# Patient Record
Sex: Female | Born: 1960 | Race: White | Hispanic: No | Smoking: Never smoker
Health system: Southern US, Community
[De-identification: ages and names within clinical notes are randomized; demographics above are authoritative.]

## PROBLEM LIST (undated history)

## (undated) DIAGNOSIS — D649 Anemia, unspecified: Secondary | ICD-10-CM

## (undated) DIAGNOSIS — E079 Disorder of thyroid, unspecified: Secondary | ICD-10-CM

## (undated) DIAGNOSIS — F419 Anxiety disorder, unspecified: Secondary | ICD-10-CM

## (undated) DIAGNOSIS — R011 Cardiac murmur, unspecified: Secondary | ICD-10-CM

## (undated) HISTORY — PX: OVARY SURGERY: SHX727

## (undated) HISTORY — PX: APPENDECTOMY: SHX54

## (undated) HISTORY — DX: Disorder of thyroid, unspecified: E07.9

## (undated) HISTORY — PX: TOE SURGERY: SHX1073

## (undated) HISTORY — PX: CYST REMOVAL NECK: SHX6281

## (undated) HISTORY — DX: Cardiac murmur, unspecified: R01.1

## (undated) HISTORY — DX: Anxiety disorder, unspecified: F41.9

## (undated) HISTORY — DX: Anemia, unspecified: D64.9

## (undated) HISTORY — PX: ABDOMINAL HYSTERECTOMY: SHX81

---

## 2012-12-04 ENCOUNTER — Encounter (INDEPENDENT_AMBULATORY_CARE_PROVIDER_SITE_OTHER): Payer: Self-pay | Admitting: General Surgery

## 2012-12-04 ENCOUNTER — Ambulatory Visit (INDEPENDENT_AMBULATORY_CARE_PROVIDER_SITE_OTHER): Payer: BC Managed Care – PPO | Admitting: General Surgery

## 2012-12-04 ENCOUNTER — Telehealth (INDEPENDENT_AMBULATORY_CARE_PROVIDER_SITE_OTHER): Payer: Self-pay | Admitting: General Surgery

## 2012-12-04 VITALS — BP 118/72 | HR 80 | Temp 98.9°F | Resp 15 | Ht 66.0 in | Wt 164.4 lb

## 2012-12-04 DIAGNOSIS — D179 Benign lipomatous neoplasm, unspecified: Secondary | ICD-10-CM

## 2012-12-04 NOTE — Progress Notes (Signed)
Chief complaint: Painful lump each forearm  History: Patient is a 52 year old female who for a number of months has had an increasingly uncomfortable subcutaneous lump on the volar aspect of her right forearm. More recently she has developed a similar small tender area on the left forearm. This is exactly where she rests her arms at her desk on each side and becomes quite tender and uncomfortable during the day. She has history of previous lipoma removal in other areas.  Past Medical History  Diagnosis Date  . Anemia   . Anxiety   . Heart murmur   . Thyroid disease    Past Surgical History  Procedure Laterality Date  . Toe surgery      toe was curved so broke bone and restraightened it  . Ovary surgery      removed cyst  . Appendectomy    . Abdominal hysterectomy    . Cyst removal neck     Current Outpatient Prescriptions  Medication Sig Dispense Refill  . Cyanocobalamin (VITAMIN B-12 IJ) Inject as directed.      Marland Kitchen levothyroxine (SYNTHROID, LEVOTHROID) 100 MCG tablet Take 100 mcg by mouth daily before breakfast.       No current facility-administered medications for this visit.   No Known Allergies History  Substance Use Topics  . Smoking status: Never Smoker   . Smokeless tobacco: Never Used  . Alcohol Use: Yes     Comment: wine occasional   Exam: BP 118/72  Pulse 80  Temp(Src) 98.9 F (37.2 C) (Temporal)  Resp 15  Ht 5\' 6"  (1.676 m)  Wt 164 lb 6.4 oz (74.571 kg)  BMI 26.55 kg/m2 General: Pleasant well-appearing Caucasian female Pertinent exam is limited extremities. On the volar aspect of the right midforearm is an approximately 2 cm freely movable fleshy subcutaneous mass. In a similar location on the left forearm is a proximally 1 cm very similar mass.  Assessment and plan: Symptomatic lipomas each forearm. As these are bothering her significantly I recommended removal under local anesthesia as an outpatient. We discussed the indications for nature of the surgery  as well as recovery and risks of bleeding and infection and recurrence. She understands and agrees to proceed.

## 2012-12-04 NOTE — Patient Instructions (Signed)

## 2012-12-04 NOTE — Telephone Encounter (Signed)
Advised patient of financial responsibility, per patient does not want to have surgery done

## 2013-06-10 ENCOUNTER — Other Ambulatory Visit: Payer: Self-pay | Admitting: Family Medicine

## 2013-06-10 DIAGNOSIS — Z1231 Encounter for screening mammogram for malignant neoplasm of breast: Secondary | ICD-10-CM

## 2013-06-29 ENCOUNTER — Ambulatory Visit: Payer: Self-pay

## 2013-07-03 ENCOUNTER — Ambulatory Visit
Admission: RE | Admit: 2013-07-03 | Discharge: 2013-07-03 | Disposition: A | Discharge status: Home / Self Care | Payer: BC Managed Care – PPO | Source: Ambulatory Visit | Attending: Family Medicine | Admitting: Family Medicine

## 2013-07-03 DIAGNOSIS — Z1231 Encounter for screening mammogram for malignant neoplasm of breast: Secondary | ICD-10-CM

## 2014-10-13 ENCOUNTER — Other Ambulatory Visit: Payer: Self-pay

## 2014-10-13 DIAGNOSIS — Z1231 Encounter for screening mammogram for malignant neoplasm of breast: Secondary | ICD-10-CM

## 2014-11-10 ENCOUNTER — Ambulatory Visit
Admission: RE | Admit: 2014-11-10 | Discharge: 2014-11-10 | Disposition: A | Discharge status: Home / Self Care | Payer: BLUE CROSS/BLUE SHIELD | Source: Ambulatory Visit

## 2014-11-10 DIAGNOSIS — Z1231 Encounter for screening mammogram for malignant neoplasm of breast: Secondary | ICD-10-CM

## 2015-05-10 ENCOUNTER — Emergency Department (HOSPITAL_COMMUNITY): Payer: BLUE CROSS/BLUE SHIELD

## 2015-05-10 ENCOUNTER — Encounter (HOSPITAL_COMMUNITY): Payer: Self-pay | Admitting: Emergency Medicine

## 2015-05-10 ENCOUNTER — Emergency Department (HOSPITAL_COMMUNITY)
Admission: EM | Admit: 2015-05-10 | Discharge: 2015-05-10 | Disposition: A | Discharge status: Home / Self Care | Payer: BLUE CROSS/BLUE SHIELD | Attending: Emergency Medicine | Admitting: Emergency Medicine

## 2015-05-10 DIAGNOSIS — S3992XA Unspecified injury of lower back, initial encounter: Secondary | ICD-10-CM | POA: Diagnosis not present

## 2015-05-10 DIAGNOSIS — Y9289 Other specified places as the place of occurrence of the external cause: Secondary | ICD-10-CM | POA: Insufficient documentation

## 2015-05-10 DIAGNOSIS — W000XXA Fall on same level due to ice and snow, initial encounter: Secondary | ICD-10-CM | POA: Diagnosis not present

## 2015-05-10 DIAGNOSIS — R011 Cardiac murmur, unspecified: Secondary | ICD-10-CM | POA: Diagnosis not present

## 2015-05-10 DIAGNOSIS — Y9389 Activity, other specified: Secondary | ICD-10-CM | POA: Insufficient documentation

## 2015-05-10 DIAGNOSIS — S199XXA Unspecified injury of neck, initial encounter: Secondary | ICD-10-CM | POA: Diagnosis not present

## 2015-05-10 DIAGNOSIS — Z8659 Personal history of other mental and behavioral disorders: Secondary | ICD-10-CM | POA: Diagnosis not present

## 2015-05-10 DIAGNOSIS — Y998 Other external cause status: Secondary | ICD-10-CM | POA: Diagnosis not present

## 2015-05-10 DIAGNOSIS — Z862 Personal history of diseases of the blood and blood-forming organs and certain disorders involving the immune mechanism: Secondary | ICD-10-CM | POA: Diagnosis not present

## 2015-05-10 DIAGNOSIS — S060X0A Concussion without loss of consciousness, initial encounter: Secondary | ICD-10-CM | POA: Insufficient documentation

## 2015-05-10 DIAGNOSIS — E079 Disorder of thyroid, unspecified: Secondary | ICD-10-CM | POA: Insufficient documentation

## 2015-05-10 DIAGNOSIS — R42 Dizziness and giddiness: Secondary | ICD-10-CM | POA: Diagnosis present

## 2015-05-10 DIAGNOSIS — F0781 Postconcussional syndrome: Secondary | ICD-10-CM

## 2015-05-10 LAB — I-STAT CHEM 8, ED
BUN: 12 mg/dL (ref 6–20)
CALCIUM ION: 1.15 mmol/L (ref 1.12–1.23)
Chloride: 104 mmol/L (ref 101–111)
Creatinine, Ser: 0.8 mg/dL (ref 0.44–1.00)
Glucose, Bld: 96 mg/dL (ref 65–99)
HEMATOCRIT: 41 % (ref 36.0–46.0)
HEMOGLOBIN: 13.9 g/dL (ref 12.0–15.0)
Potassium: 3.6 mmol/L (ref 3.5–5.1)
SODIUM: 141 mmol/L (ref 135–145)
TCO2: 26 mmol/L (ref 0–100)

## 2015-05-10 MED ORDER — LORAZEPAM 1 MG PO TABS
1.0000 mg | ORAL_TABLET | Freq: Once | ORAL | Status: AC
  Administered 2015-05-10: 1 mg via ORAL
  Filled 2015-05-10: qty 1

## 2015-05-10 MED ORDER — MECLIZINE HCL 25 MG PO TABS
25.0000 mg | ORAL_TABLET | Freq: Once | ORAL | Status: AC
  Administered 2015-05-10: 25 mg via ORAL
  Filled 2015-05-10: qty 1

## 2015-05-10 MED ORDER — MECLIZINE HCL 25 MG PO TABS: 25.0000 mg | ORAL_TABLET | Freq: Three times a day (TID) | ORAL | Status: AC | PRN

## 2015-05-10 MED ORDER — ACETAMINOPHEN 500 MG PO TABS
1000.0000 mg | ORAL_TABLET | Freq: Once | ORAL | Status: AC
  Administered 2015-05-10: 1000 mg via ORAL
  Filled 2015-05-10: qty 2

## 2015-05-10 NOTE — ED Notes (Signed)
Off floor for testing 

## 2015-05-10 NOTE — Discharge Instructions (Signed)
Post-Concussion Syndrome No driving or operating machinery while feeling dizzy or while taking the medication prescribed. Take Tylenol as needed for pain. See your primary care physician if continuing to feel dizziness or not feeling better in 1 week. Return if you feel worse for any reason. Post-concussion syndrome is the symptoms that can occur after a head injury. These symptoms can last from weeks to months. HOME CARE   Take medicines only as told by your doctor.  Do not take aspirin.  Sleep with your head raised to help with headaches.  Avoid activities that can cause another head injury.  Do not play contact sports like football, hockey, soccer, or basketball.  Do not do other risky activities like downhill skiing, martial arts, or horseback riding until your doctor says it is okay.  Keep all follow-up visits as told by your doctor. This is important. GET HELP IF:   You have a harder time:  Paying attention.  Focusing.  Remembering.  Learning new information.  Dealing with stress.  You need more time to complete tasks.  You are easily bothered (irritable).  You have more symptoms. Get help if you have any of these symptoms for more than two weeks after your injury:   Long-lasting (chronic) headaches.  Dizziness.  Trouble balancing.  Feeling sick to your stomach (nauseous).  Trouble with your vision.  Noise or light bothers you more.  Depression.  Mood swings.  Feeling worried (anxious).  Easily bothered.  Memory problems.  Trouble concentrating or paying attention.  Sleep problems.  Feeling tired all of the time. GET HELP RIGHT AWAY IF:  You feel confused.  You feel very sleepy.  You are hard to wake up.  You feel sick to your stomach.  You keep throwing up (vomiting).  You feel like you are moving when you are not (vertigo).  Your eyes move back and forth very quickly.  You start shaking (convulsing) or pass out (faint).  You  have very bad headaches that do not get better with medicine.  You cannot use your arms or legs like normal.  One of the black centers of your eyes (pupils) is bigger than the other.  You have clear or bloody fluid coming from your nose or ears.  Your problems get worse, not better. MAKE SURE YOU:  Understand these instructions.  Will watch your condition.  Will get help right away if you are not doing well or get worse.   This information is not intended to replace advice given to you by your health care provider. Make sure you discuss any questions you have with your health care provider.   Document Released: 05/24/2004 Document Revised: 05/07/2014 Document Reviewed: 07/22/2013 Elsevier Interactive Patient Education [employer].

## 2015-05-10 NOTE — ED Notes (Signed)
Back from MRI.

## 2015-05-10 NOTE — ED Notes (Addendum)
Per EMS, fell on Sunday. C/o neck stiffness with vertigo. Examined by EMS on Sunday, no pain at the time.   Pt states the vertigo is terrible, she has a hx of vertigo. States she slipped on the ice, hit her head, did not lose consciousness, has been nauseous, not on any blood thinners, bruise to back of head, bilateral grip/dorsi and plantar flexion, PERRL, no slurred speech, alert and oriented x 4, denies vomiting.

## 2015-05-10 NOTE — ED Provider Notes (Signed)
CSN: [id number]     Arrival date & time 05/10/15  0844 History   First MD Initiated Contact with Patient 05/10/15 [phone]     Chief Complaint  Patient presents with  . Fall  . Dizziness     (Consider location/radiation/quality/duration/timing/severity/associated sxs/prior Treatment) HPI Patient slipped and fell on ice 3 days ago , landing on her buttocks and then falling backwards and striking her occiput. She developed vertigo onset 2 days ago. Symptoms accompanied by occipital headache and neck pain and pain at presacral area. Vertigo is worse with closing her eyes have not improved by anything no nausea or vomiting no changes in vision or speech. No other associated symptoms. No treatment prior to coming here.  Past Medical History  Diagnosis Date  . Anemia   . Anxiety   . Heart murmur   . Thyroid disease    Past Surgical History  Procedure Laterality Date  . Toe surgery      toe was curved so broke bone and restraightened it  . Ovary surgery      removed cyst  . Appendectomy    . Abdominal hysterectomy    . Cyst removal neck     Family History  Problem Relation Age of Onset  . Cancer Mother     breast  . Cancer Father     lung  . Cancer Sister     cervical   Social History  Substance Use Topics  . Smoking status: Never Smoker   . Smokeless tobacco: Never Used  . Alcohol Use: Yes     Comment: wine occasional   OB History    No data available     Review of Systems  Musculoskeletal: Positive for back pain and neck pain.  Neurological: Positive for headaches.  All other systems reviewed and are negative.     Allergies  Tyloxapol  Home Medications   Prior to Admission medications   Medication Sig Start Date End Date Taking? Authorizing Provider  levothyroxine (SYNTHROID, LEVOTHROID) 100 MCG tablet Take 100 mcg by mouth daily before breakfast.   Yes Historical Provider, MD   BP 116/92 mmHg  Pulse 93  Temp(Src) 97.8 F (36.6 C) (Oral)  Resp 16  SpO2  98% Physical Exam  Constitutional: She is oriented to person, place, and time. She appears well-developed and well-nourished. No distress.  HENT:  Head: Normocephalic and atraumatic.  Bilateral tympanic membranes  Eyes: Conjunctivae are normal. Pupils are equal, round, and reactive to light.  Neck: Neck supple. No tracheal deviation present. No thyromegaly present.  Cardiovascular: Normal rate and regular rhythm.   No murmur heard. Pulmonary/Chest: Effort normal and breath sounds normal.  Abdominal: Soft. Bowel sounds are normal. She exhibits no distension. There is no tenderness.  Musculoskeletal: Normal range of motion. She exhibits no edema or tenderness.  Cervical spine nontender thoracic spine nontender lumbar spine nontender. She is tender at presacral area. No crepitance pelvis stable. All 4 extremities without contusion abrasion or tenderness neurovascular intact  Neurological: She is alert and oriented to person, place, and time. No cranial nerve deficit. Coordination normal.  Gait normal Romberg normal pronator drift normal DTR symmetric bilaterally at knee jerk and ankle jerk biceps  Skin: Skin is warm and dry. No rash noted.  Psychiatric: She has a normal mood and affect.  Nursing note and vitals reviewed.   ED Course  Procedures (including critical care time) Labs Review Labs Reviewed  I-STAT CHEM 8, ED    Imaging Review No results  found. I have personally reviewed and evaluated these images and lab results as part of my medical decision-making.   EKG Interpretation   Date/Time:  Tuesday May 10 2015 09:12:59 EST Ventricular Rate:  91 PR Interval:  149 QRS Duration: 84 QT Interval:  355 QTC Calculation: 437 R Axis:   63 Text Interpretation:  Sinus rhythm Consider right atrial enlargement  Baseline wander in lead(s) V3 No old tracing to compare Confirmed by  Rita Prom  MD, Equan Cogbill [phone]) on 05/10/2015 9:42:00 AM       11:55 AM patient feels improved after  treatment with meclizine and Ativan orally and Tylenol. She is alert and ambulatory. No longer dizzy. Results for orders placed or performed during the hospital encounter of 05/10/15  I-Stat Chem 8, ED  (not at [facility], [facility])  Result Value Ref Range   Sodium 141 135 - 145 mmol/L   Potassium 3.6 3.5 - 5.1 mmol/L   Chloride 104 101 - 111 mmol/L   BUN 12 6 - 20 mg/dL   Creatinine, Ser 0.80 0.44 - 1.00 mg/dL   Glucose, Bld 96 65 - 99 mg/dL   Calcium, Ion 1.15 1.12 - 1.23 mmol/L   TCO2 26 0 - 100 mmol/L   Hemoglobin 13.9 12.0 - 15.0 g/dL   HCT 41.0 36.0 - 46.0 %   Ct Pelvis Wo Contrast  05/10/2015  CLINICAL DATA:  Slipped on ice 05/08/2015, left buttock pain EXAM: CT PELVIS WITHOUT CONTRAST TECHNIQUE: Multidetector CT imaging of the pelvis was performed following the standard protocol without intravenous contrast. COMPARISON:  None. FINDINGS: Sagittal images shows no evidence of sacrum or coccyx fracture. Mild disc space flattening at L5-S1 level. Coronal images shows no hip fracture. Bilateral hip joints are symmetrical in appearance. Minimal degenerative changes pubic symphysis. No pubic ramus fracture. The SI joints are symmetrical in appearance. There is no evidence of pelvic wall hematoma. Axial images shows no iliac bone fracture. No subcutaneous fluid collection. The distal abdominal aorta and iliac arteries are unremarkable. No pelvic hematoma. The patient is status post hysterectomy. Urinary bladder is under distended grossly unremarkable. No colonic obstruction. The patient is status post appendectomy. No pericecal inflammation. IMPRESSION: 1. No acute fractures are identified. No pelvic hematoma or fluid collection. 2. Status post hysterectomy. Status post appendectomy. No pericecal inflammation. 3. Mild degenerative changes pubic symphysis. Mild disc space flattening at L5-S1 level. No sacral or coccyx fracture. Electronically Signed   By: Lahoma Crocker M.D.   On: 05/10/2015 10:45   Mr Brain Wo  Contrast  05/10/2015  CLINICAL DATA:  Severe vertigo, worse after falling on ice 2 days ago. No loss of consciousness. Trauma to head. EXAM: MRI HEAD WITHOUT CONTRAST TECHNIQUE: Multiplanar, multiecho pulse sequences of the brain and surrounding structures were obtained without intravenous contrast. COMPARISON:  None. FINDINGS: No acute infarct, hemorrhage, or mass lesion is present. The ventricles are of normal size. No significant extraaxial fluid collection is present. No significant white matter disease is present. No acute or focal intracranial trauma is present. The internal auditory canals are within normal limits bilaterally. The brainstem and cerebellum are within normal limits. The skullbase is within normal limits. Midline sagittal images are unremarkable. Flow is present in the major intracranial arteries. The globes and orbits are within normal limits. The paranasal sinuses and mastoid air cells are clear. IMPRESSION: 1. Normal MRI appearance the brain. No evidence for acute trauma or etiology of vertigo. Electronically Signed   By: San Morelle M.D.   On:  05/10/2015 11:22    MDM  Plan prescription meclizine. Tylenol as needed for pain. 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 Follow-up with PMD if still symptomatic in a week Final diagnoses:  None   diagnosis #1 fall #2 vertigo #3 contusion to back      Orlie Dakin, MD 05/10/15 1201

## 2015-10-28 ENCOUNTER — Other Ambulatory Visit: Payer: Self-pay | Admitting: Family Medicine

## 2015-10-28 DIAGNOSIS — Z1231 Encounter for screening mammogram for malignant neoplasm of breast: Secondary | ICD-10-CM

## 2015-11-14 ENCOUNTER — Ambulatory Visit
Admission: RE | Admit: 2015-11-14 | Discharge: 2015-11-14 | Disposition: A | Discharge status: Home / Self Care | Payer: BLUE CROSS/BLUE SHIELD | Source: Ambulatory Visit | Attending: Family Medicine | Admitting: Family Medicine

## 2015-11-14 DIAGNOSIS — Z1231 Encounter for screening mammogram for malignant neoplasm of breast: Secondary | ICD-10-CM

## 2016-01-04 ENCOUNTER — Encounter: Payer: Self-pay | Admitting: Genetic Counselor

## 2016-02-08 ENCOUNTER — Encounter: Payer: Self-pay | Admitting: Genetic Counselor

## 2016-02-09 ENCOUNTER — Encounter: Payer: Self-pay | Admitting: Genetic Counselor

## 2016-02-09 ENCOUNTER — Ambulatory Visit (HOSPITAL_BASED_OUTPATIENT_CLINIC_OR_DEPARTMENT_OTHER): Payer: BLUE CROSS/BLUE SHIELD | Admitting: Genetic Counselor

## 2016-02-09 ENCOUNTER — Other Ambulatory Visit: Payer: BLUE CROSS/BLUE SHIELD

## 2016-02-09 DIAGNOSIS — Z800 Family history of malignant neoplasm of digestive organs: Secondary | ICD-10-CM

## 2016-02-09 DIAGNOSIS — Z801 Family history of malignant neoplasm of trachea, bronchus and lung: Secondary | ICD-10-CM

## 2016-02-09 DIAGNOSIS — Z803 Family history of malignant neoplasm of breast: Secondary | ICD-10-CM | POA: Diagnosis not present

## 2016-02-09 DIAGNOSIS — Z808 Family history of malignant neoplasm of other organs or systems: Secondary | ICD-10-CM

## 2016-02-09 DIAGNOSIS — Z8049 Family history of malignant neoplasm of other genital organs: Secondary | ICD-10-CM

## 2016-02-09 DIAGNOSIS — Z809 Family history of malignant neoplasm, unspecified: Secondary | ICD-10-CM

## 2016-02-09 DIAGNOSIS — Z8371 Family history of colonic polyps: Secondary | ICD-10-CM

## 2016-02-09 DIAGNOSIS — Z315 Encounter for genetic counseling: Secondary | ICD-10-CM

## 2016-02-09 NOTE — Progress Notes (Signed)
REFERRING PROVIDER: Antony Blackbird, MD [phone], [place] [address]  PRIMARY PROVIDER:  FULP, CAMMIE, MD  PRIMARY REASON FOR VISIT:  1. Family history of breast cancer in female   2. Family history of pancreatic cancer   3. Family history of colonic polyps   4. Family history of lung cancer   5. Family history of basal cell carcinoma   6. Family history of cervical cancer   7. Family history of cancer      HISTORY OF PRESENT ILLNESS:   Kaitlin Escobar, a 55 y.o. female, was seen for a [place] cancer genetics consultation at the request of Dr. Chapman Fitch due to a family history of breast and other cancers.  Kaitlin Escobar presents to clinic today to discuss the possibility of a hereditary predisposition to cancer, genetic testing, and to further clarify her future cancer risks, as well as potential cancer risks for family members.    Kaitlin Escobar is a 55 y.o. female with no personal history of cancer.  She has does have a history of surgical removal of multiple benign lipomas.   HORMONAL RISK FACTORS:  Menarche was at age 83.  First live birth at age 6.  OCP use for approximately 25 years.  Ovaries intact: yes.  Hysterectomy: yes, in 2008 due to fibroids.  Menopausal status: had a hysterectomy in 2008.  HRT use: 0 years. Colonoscopy: yes, first colonoscopy in 2010; reports no polyps, normal other than hemorrhoids; is on a 10-year schedule. Mammogram within the last year: yes. Number of breast biopsies: 0. Up to date with pelvic exams:  n/a. Any excessive radiation exposure/other exposures in the past:  Reports history of secondhand smoke exposure (father smoked)2  Past Medical History:  Diagnosis Date  . Anemia   . Anxiety   . Heart murmur   . Thyroid disease     Past Surgical History:  Procedure Laterality Date  . ABDOMINAL HYSTERECTOMY    . APPENDECTOMY    . CYST REMOVAL NECK    . OVARY SURGERY     removed cyst  . TOE SURGERY     toe was curved so broke bone and  restraightened it    Social History   Social History  . Marital status: Divorced    Spouse name: N/A  . Number of children: N/A  . Years of education: N/A   Social History Main Topics  . Smoking status: Never Smoker  . Smokeless tobacco: Never Used  . Alcohol use Yes     Comment: wine occasional - 1 glass per month or less  . Drug use: No  . Sexual activity: Not on file   Other Topics Concern  . Not on file   Social History Narrative  . No narrative on file     FAMILY HISTORY:  We obtained a detailed, 4-generation family history.  Significant diagnoses are listed below: Family History  Problem Relation Age of Onset  . Breast cancer Mother 36  . Lung cancer Father 36    d. 50; former smoker; +possible asbestos exposure  . Colon polyps Father     >10 polyps; "lots of polyps"; removed, sometimes every 6 months  . 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 Cervical cancer Sister     unspecified age  . Alzheimer's disease Maternal Grandmother     d. 74  . Breast cancer Maternal Grandmother     dx 90s - no treatment due to  age  . Alzheimer's disease Maternal Grandfather     d. 46  . Other Maternal Grandfather     leg amputation following GSW  . Mental illness Paternal Grandmother   . Other Son     hx of benign breast lump; no surgery  . Heart attack Son 23    worked out a lot and did not take care of himself after; does not see cardiologist  . Cancer Other     maternal great uncle (MGM's brother) dx NOS cancer; d. later age; lim info  . Pancreatic cancer Paternal Uncle     dx. [phone]  . Colon polyps Paternal Uncle     "lots of polyps"  . Kidney Stones Paternal Uncle     Kaitlin Escobar has two sons, ages 15 and 48.  Her oldest son was found to have a lump in one of his breasts and this was determined to be benign and he has not needed surgery.  This son has one 84-year old daughter.  Kaitlin Escobar youngest son had a heart attack three years ago, at  approximately 56 years of age.  She attributes this to his working out aggressively and not "putting enough back into his body" afterwards.  She reports that he does not follow-up with a cardiologist.  Kaitlin Escobar has one full sister who is currently 33 and who has a history of cervical cancer at an unspecified age.  She has two daughters of her own--both of whom are cancer-free.  Kaitlin Escobar also has two paternal half-brothers who are currently in their 33s. She has limited information for these brothers.  She does report that one of her half-brothers has a grandchild who has been diagnosed with a genetic brain abnormality of unspecified type.    Kaitlin Escobar mother was diagnosed with breast cancer at 2, and she later suffered a recurrence and this cancer metastasized to her back and spine.  She passed away at 101.  Kaitlin Escobar mother was an only child.  Kaitlin Escobar maternal grandmother was found to have a breast lump in her 22s, probably a breast cancer, but that was not treated due to her age and due to her having alzheimer's.  She passed away at 17. Kaitlin Escobar grandmother had 55 siblings, most of whom passed away at later ages in life.  Kaitlin Escobar has limited information for these maternal great aunts/uncles, but does recall that one great uncle had an unspecified type of cancer.  Kaitlin Escobar maternal grandfather died of alzheimer's related illness at 58.    Kaitlin Escobar father had a history of smoking and he was diagnosed with lung cancer at 11 and passed away a year later.  He had a history of basal cell carcinoma and also a history of greater than 10 colon polyps.  Kaitlin Escobar describes that he had "lots of polyps" and had to go for colonoscopies with polypectomies every 6 months, at one time in his life.  He had three full brothers and one full sister, all of whom have passed away.  Kaitlin Escobar has no information for most of these siblings.  She does report that one uncle was diagnosed with pancreatic cancer and passed away  soon after at the age of 10.  This uncle was not a smoker or drinker.  He also had a history of "lots of polyps" and he had a history of kidney stones.  This uncle had no biological children, only two adopted children.  Kaitlin Escobar has  some limited information for some paternal first cousins, but she is unaware of any cancer history for those she has been in touch with.  Her paternal grandmother had a history of mental illness and she passed from suicide in her 31s.  Her grandfather died before. Ms. Gullo was born and she has no further information for him.  Ms. Lizana has no information for her paternal great aunts/uncles and great grandparents.    Ms. Pacifico is unaware of any previous family history of genetic testing for hereditary cancer risks.  Patient's maternal ancestors are of [place] and [name] descent, and paternal ancestors are of Native [place] and [place] descent. There is no reported Ashkenazi Jewish ancestry. There is no known consanguinity.  GENETIC COUNSELING ASSESSMENT: Kaitlin Escobar is a 55 y.o. female with a family history of cancer and colon polyps which is somewhat suggestive of a hereditary cancer syndrome and predisposition to cancer. We, therefore, discussed and recommended the following at today's visit.   DISCUSSION: We reviewed the characteristics, features and inheritance patterns of hereditary cancer syndromes, particularly those caused by mutations within the BRCA1/2, Lynch syndrome, and APC genes. We also discussed genetic testing, including the appropriate family members to test, the process of testing, insurance coverage and turn-around-time for results. We discussed the implications of a negative, positive and/or variant of uncertain significant result. We recommended Ms. Rion pursue genetic testing for the 42-gene [facility] Common Hereditary Cancers Panel (Breast, Gyn, GI) through [facility].  The 42-gene [facility] Common Hereditary Cancers Panel (Breast, Gyn, GI) performed  by [facility], [place]) includes sequencing and/or deletion/duplication analysis for the following genes: APC, ATM, AXIN2, BARD1, BMPR1A, BRCA1, BRCA2, BRIP1, CDH1, CDKN2A, CHEK2, DICER1, EPCAM, GREM1, KIT, MEN1, MLH1, MSH2, MSH6, MUTYH, NBN, NF1, PALB2, PDGFRA, PMS2, POLD1, POLE, PTEN, RAD50, RAD51C, RAD51D, SDHA, SDHB, SDHC, SDHD, SMAD4, SMARCA4, STK11, TP53, TSC1, TSC2, and VHL.  Based on Ms. Kutscher family history of cancer, she meets medical criteria for genetic testing. Despite that she meets criteria, she may still have an out of pocket cost. We discussed that most people will have an out-of-pocket cost of $100 or less.  She is advised to call us or the laboratory if she receives an expensive bill, as the laboratory has financial assistance options that could reduce her cost, if she qualifies.  Based on the patient's personal and family history, statistical models Baker Janus model and IBIS/Tyrer Lowella Petties)  and literature data were used to estimate her risk of developing breast cancer. These estimate her lifetime risk of developing breast cancer to be approximately 14% to 23.4%. This estimation does not take into account any genetic testing results.  The patient's lifetime breast cancer risk is a preliminary estimate based on available information using one of several models endorsed by the [place] (ACS). The ACS recommends consideration of breast MRI screening as an adjunct to mammography for patients at high risk (defined as 20% or greater lifetime risk). A more detailed breast cancer risk assessment can be considered, if clinically indicated.   PLAN: After considering the risks, benefits, and limitations, Ms. Maselli  provided informed consent to pursue genetic testing and the blood sample was sent to [facility] for analysis of the 42-gene [facility] Common Hereditary Cancers Panel (Breast, Gyn, GI). Results should be available within approximately 2-3 weeks' time, at  which point they will be disclosed by telephone to Ms. Carolan, as will any additional recommendations warranted by these results. Ms. Hofmeister will receive a summary of  her genetic counseling visit and a copy of her results once available. This information will also be available in Epic. We encouraged Ms. Rossi to remain in contact with cancer genetics annually so that we can continuously update the family history and inform her of any changes in cancer genetics and testing that may be of benefit for her family. Ms. Dudziak questions were answered to her satisfaction today. Our contact information was provided should additional questions or concerns arise.  Thank you for the referral and allowing [name] to share in the care of your patient.   Jeanine Luz, MS, [facility] Certified Genetic Counselor [email or web address] Phone: [phone]  The patient was seen for a total of 60 minutes in face-to-face genetic counseling.  This patient was discussed with Drs. Magrinat, Lindi Adie and/or Burr Medico who agrees with the above.    _______________________________________________________________________ For Office Staff:  Number of people involved in session: 1 Was an Intern/ student involved with case: no

## 2016-03-07 ENCOUNTER — Telehealth: Payer: Self-pay | Admitting: Genetic Counselor

## 2016-03-07 ENCOUNTER — Other Ambulatory Visit: Payer: Self-pay | Admitting: Specialist

## 2016-03-07 DIAGNOSIS — M8580 Other specified disorders of bone density and structure, unspecified site: Secondary | ICD-10-CM

## 2016-03-08 NOTE — Telephone Encounter (Signed)
Discussed with Kaitlin Escobar that her genetic test results were negative for mutations within any of 42 genes on the [facility] Common Hereditary Cancers Panel (Breast, Gyn, GI) through [facility].  Additionally, no variants of uncertain significance (VUSes) were found.  Discussed that this is most likely a reassuring result for [name].  Discussed that her sister can still have genetic testing, as there may be a mutation in [name] maternal family that she herself just did not inherit.  [name] and other women in the family are still at an increased risk for breast cancer, simply due to the family history of breast cancer.  She should continue to follow her doctors' recommendations for cancer screening.  [name] is happy to receive this news.  She knows she is welcome to call with any questions she may have.  I have emailed her a copy of her results.

## 2016-03-09 ENCOUNTER — Ambulatory Visit
Admission: RE | Admit: 2016-03-09 | Discharge: 2016-03-09 | Disposition: A | Discharge status: Home / Self Care | Payer: BLUE CROSS/BLUE SHIELD | Source: Ambulatory Visit | Attending: Specialist | Admitting: Specialist

## 2016-03-09 ENCOUNTER — Ambulatory Visit: Payer: Self-pay | Admitting: Genetic Counselor

## 2016-03-09 DIAGNOSIS — Z1379 Encounter for other screening for genetic and chromosomal anomalies: Secondary | ICD-10-CM

## 2016-03-09 DIAGNOSIS — M8580 Other specified disorders of bone density and structure, unspecified site: Secondary | ICD-10-CM

## 2016-03-09 DIAGNOSIS — Z803 Family history of malignant neoplasm of breast: Secondary | ICD-10-CM

## 2016-03-09 DIAGNOSIS — Z809 Family history of malignant neoplasm, unspecified: Secondary | ICD-10-CM

## 2016-03-11 DIAGNOSIS — Z1379 Encounter for other screening for genetic and chromosomal anomalies: Secondary | ICD-10-CM | POA: Insufficient documentation

## 2016-03-11 NOTE — Progress Notes (Signed)
GENETIC TEST RESULT  HPI: Ms. Kaitlin Escobar was previously seen in the [facility] due to a family history of breast and other cancers, family history of colon polyps, and concerns regarding a hereditary predisposition to cancer. Please refer to our prior cancer genetics clinic note from February 09, 2016 for more information regarding Ms. Kaitlin Escobar's medical, social and family histories, and our assessment and recommendations, at the time. Ms. Kaitlin Escobar recent genetic test results were disclosed to her, as were recommendations warranted by these results. These results and recommendations are discussed in more detail below.  GENETIC TEST RESULTS: At the time of Ms. Kaitlin Escobar visit on 02/09/16, we recommended she pursue genetic testing of the 42-gene [facility] Common Hereditary Cancers Panel (Breast, Gyn, GI) through [facility]. The 42-gene [facility] Common Hereditary Cancers Panel (Breast, Gyn, GI) performed by [facility], [place]) includes sequencing and/or deletion/duplication analysis for the following genes: APC, ATM, AXIN2, BARD1, BMPR1A, BRCA1, BRCA2, BRIP1, CDH1, CDKN2A, CHEK2, DICER1, EPCAM, GREM1, KIT, MEN1, MLH1, MSH2, MSH6, MUTYH, NBN, NF1, PALB2, PDGFRA, PMS2, POLD1, POLE, PTEN, RAD50, RAD51C, RAD51D, SDHA, SDHB, SDHC, SDHD, SMAD4, SMARCA4, STK11, TP53, TSC1, TSC2, and VHL.  Those results are now back, the report date for which is March 06, 2016.  Genetic testing was normal, and did not reveal a deleterious mutation in these genes.  Additionally, no variants of uncertain significance (VUSes) were found.  The test report will be scanned into EPIC and will be located under the Results Review tab in the Pathology>Molecular Pathology section.   We discussed with Ms. Kaitlin Escobar that since the current genetic testing is not perfect, it is possible there may be a gene mutation in one of these genes that current testing cannot detect, but that chance is small. We also discussed,  that it is possible that another gene that has not yet been discovered, or that we have not yet tested, is responsible for the cancer diagnoses in the family, and it is, therefore, important to remain in touch with cancer genetics in the future so that we can continue to offer Ms. Kaitlin Escobar the most up-to-date genetic testing.   CANCER SCREENING RECOMMENDATIONS: This normal result is reassuring and indicates that Ms. Kaitlin Escobar does not likely have an increased risk of cancer due to a mutation in one of these genes.  We, therefore, recommended  Ms. Kaitlin Escobar continue to follow the cancer screening guidelines provided by her primary healthcare providers.   RECOMMENDATIONS FOR FAMILY MEMBERS: Women in this family might be at some increased risk of developing breast cancer, over the general population risk, simply due to the family history of breast cancer. We recommended women in this family have a yearly mammogram beginning at age 34, or 68 years younger than the earliest onset of cancer, an annual clinical breast exam, and perform monthly breast self-exams. Women in this family should also have a gynecological exam as recommended by their primary provider. All family members should have a colonoscopy by age 29.  Based on Ms. Kaitlin Escobar family history, we recommended her sister also have genetic counseling and testing, as there could be a gene mutation in the family that explains the maternal family history of breast cancer, that Ms. Kaitlin Escobar herself just did not inherit. Ms. Kaitlin Escobar will let us know if we can be of any assistance in coordinating genetic counseling and/or testing for this family member.   FOLLOW-UP: Lastly, we discussed with Ms. Kaitlin Escobar that cancer genetics is a rapidly advancing field and it is  possible that new genetic tests will be appropriate for her and/or her family members in the future. We encouraged her to remain in contact with cancer genetics on an annual basis so we can update her personal and family  histories and let her know of advances in cancer genetics that may benefit this family.   Our contact number was provided. Ms. Kaitlin Escobar questions were answered to her satisfaction, and she knows she is welcome to call us at anytime with additional questions or concerns.   Kaitlin Luz, MS, [facility] Certified Genetic Counselor [email or web address] Phone: [phone]

## 2016-10-19 ENCOUNTER — Encounter (HOSPITAL_COMMUNITY): Payer: Self-pay

## 2016-10-25 ENCOUNTER — Encounter (HOSPITAL_COMMUNITY): Payer: Self-pay

## 2016-12-08 IMAGING — CT CT PELVIS W/O CM
2 of 3 series · 16 of 46 positions shown, 18 images · non-contrast
Comparison: None.

CLINICAL DATA: Slipped on ice 05/08/2015, left buttock pain

EXAM:
CT PELVIS WITHOUT CONTRAST
TECHNIQUE: Multidetector CT imaging of the pelvis was performed following the
standard protocol without intravenous contrast.

[Series 3: pelvis st · axial · 0.74mm/px · z∈[+1142,+1336]mm · 13 of 45 slices shown, 15 images]
[im 3/45  soft-tissue]
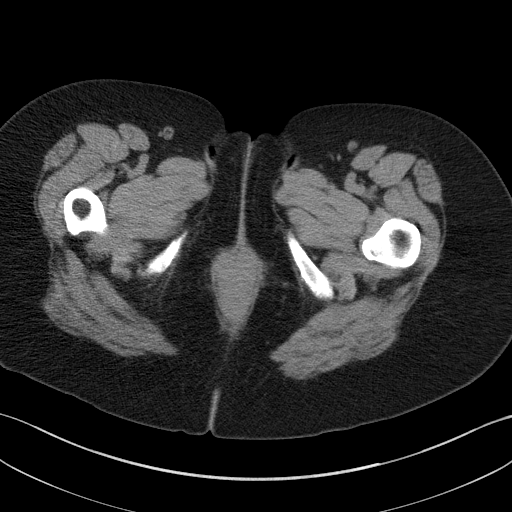
[im 3/45  bone]
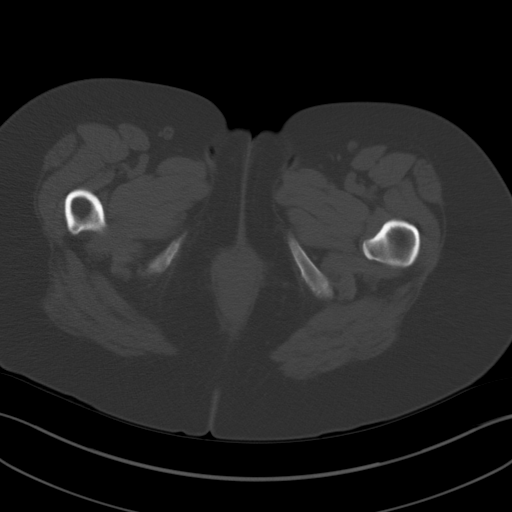
[im 6/45  soft-tissue]
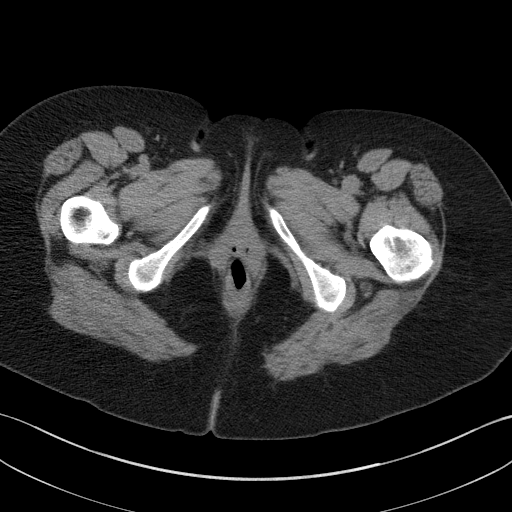
[im 9/45  soft-tissue]
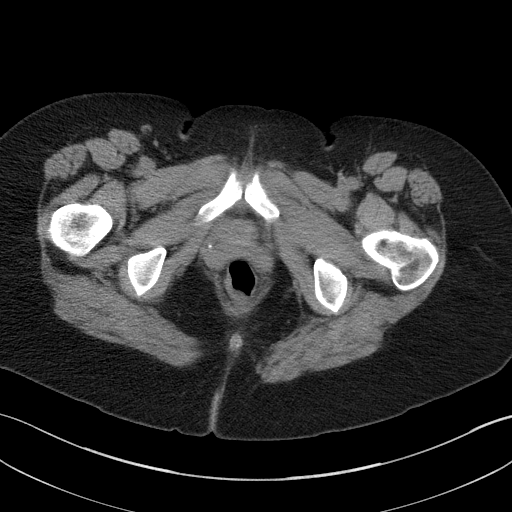
[im 13/45  soft-tissue]
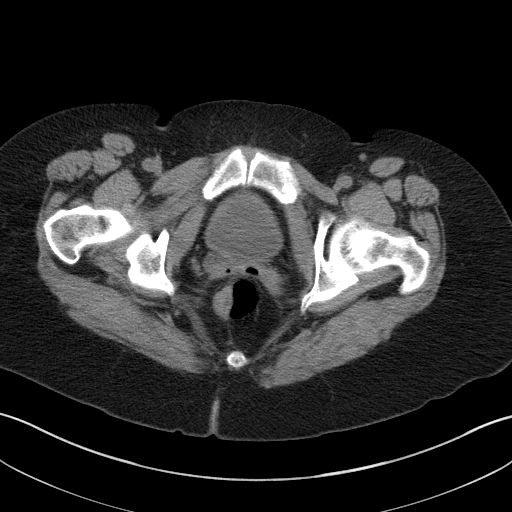
[im 16/45  soft-tissue]
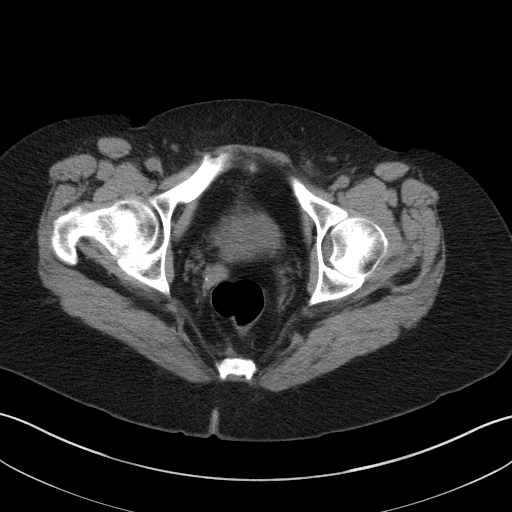
[im 19/45  soft-tissue]
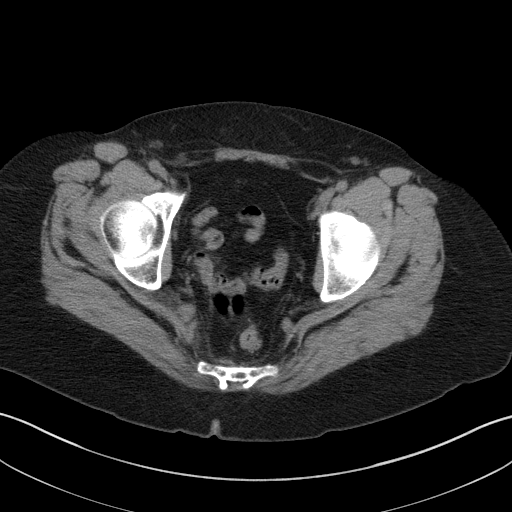
[im 23/45  soft-tissue]
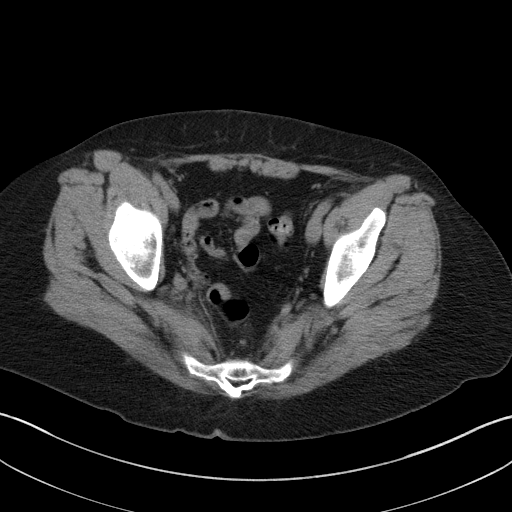
[im 26/45  soft-tissue]
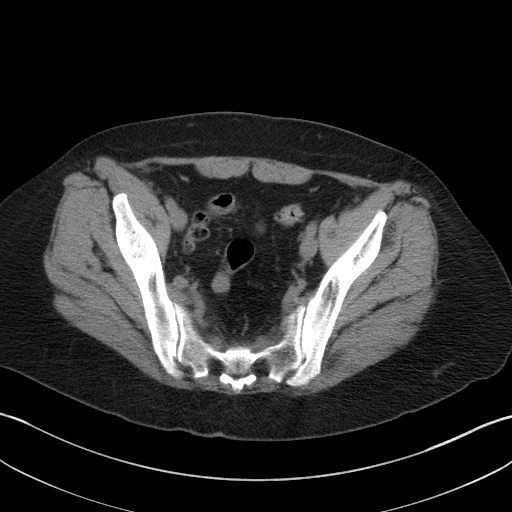
[im 29/45  soft-tissue]
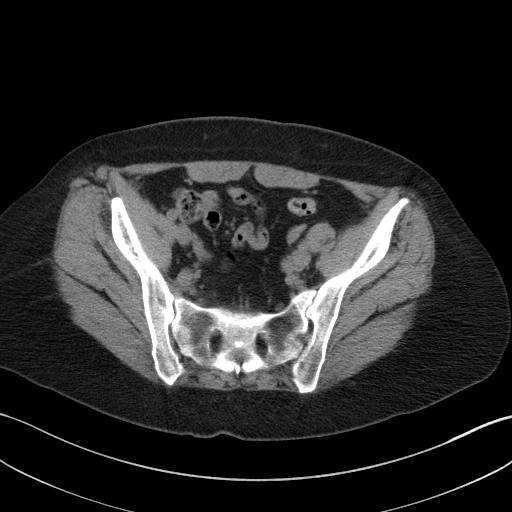
[im 29/45  bone]
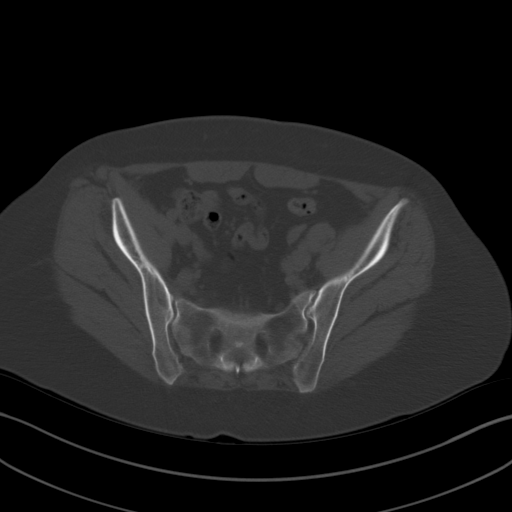
[im 32/45  soft-tissue]
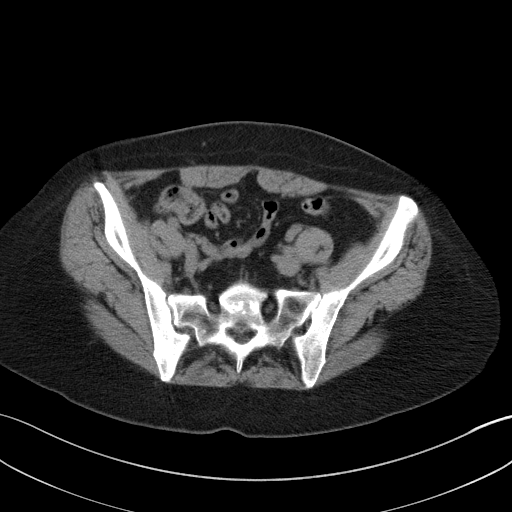
[im 36/45  soft-tissue]
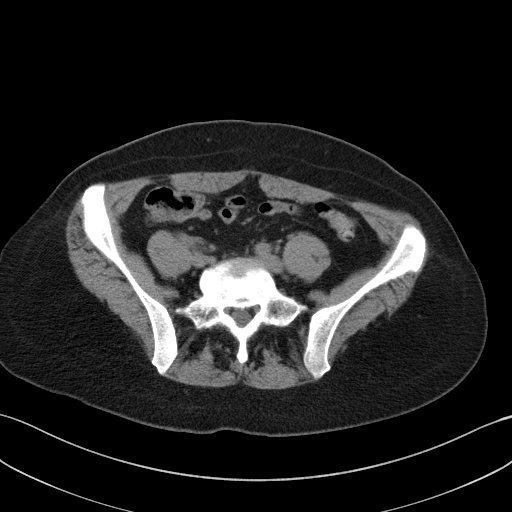
[im 39/45  soft-tissue]
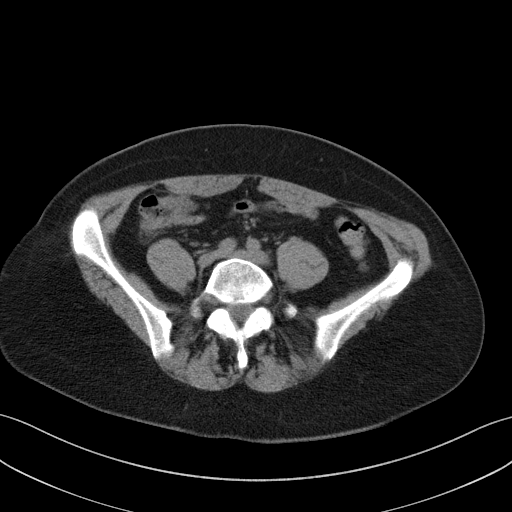
[im 42/45  soft-tissue]
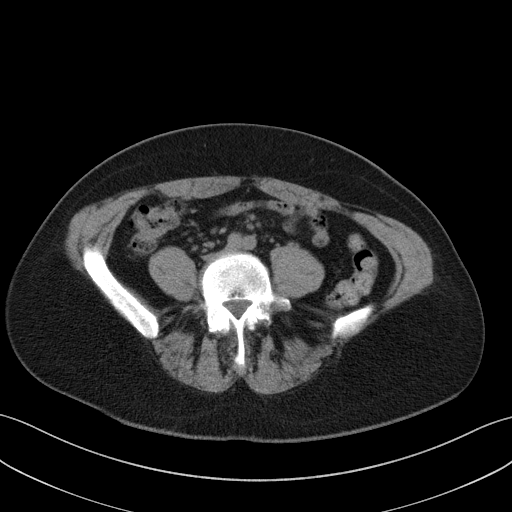

[Series 4: coronal images · coronal · 0.56mm/px · 3 of 95 slices shown]
[im 32/95  soft-tissue]
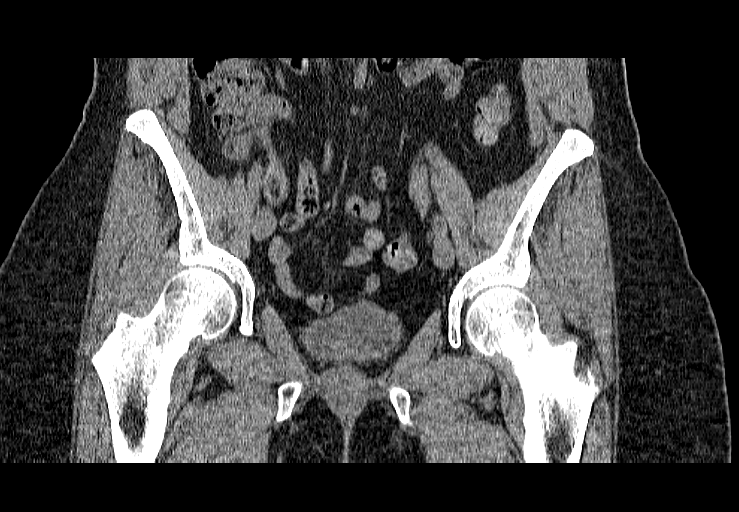
[im 42/95  soft-tissue]
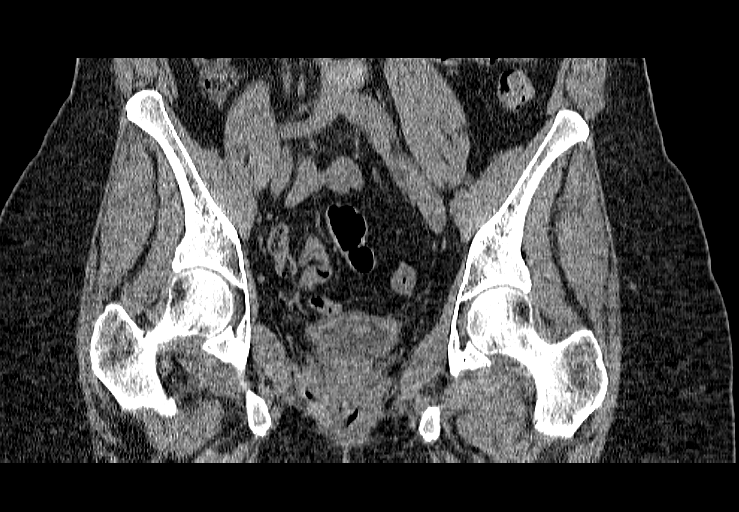
[im 53/95  soft-tissue]
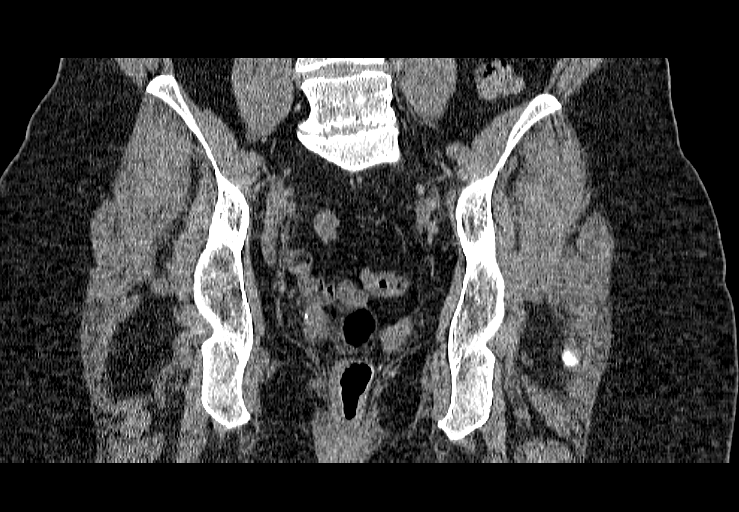

[16 of 46 positions shown; findings below may reference images not displayed]

FINDINGS: Sagittal images shows no evidence of sacrum or coccyx fracture. Mild
disc space flattening at L5-S1 level.

Coronal images shows no hip fracture. Bilateral hip joints are
symmetrical in appearance. Minimal degenerative changes pubic
symphysis. No pubic ramus fracture. The SI joints are symmetrical in
appearance.

There is no evidence of pelvic wall hematoma. Axial images shows no
iliac bone fracture. No subcutaneous fluid collection.

The distal abdominal aorta and iliac arteries are unremarkable. No
pelvic hematoma. The patient is status post hysterectomy. Urinary
bladder is under distended grossly unremarkable. No colonic
obstruction. The patient is status post appendectomy. No pericecal
inflammation.
IMPRESSION: 1. No acute fractures are identified. No pelvic hematoma or fluid
collection.
2. Status post hysterectomy. Status post appendectomy. No pericecal
inflammation.
3. Mild degenerative changes pubic symphysis. Mild disc space
flattening at L5-S1 level. No sacral or coccyx fracture.
